# Patient Record
Sex: Female | Born: 1995 | Race: Black or African American | Hispanic: No | Marital: Single | Smoking: Former smoker
Health system: Southern US, Community
[De-identification: ages and names within clinical notes are randomized; demographics above are authoritative.]

## PROBLEM LIST (undated history)

## (undated) DIAGNOSIS — F1911 Other psychoactive substance abuse, in remission: Secondary | ICD-10-CM

## (undated) DIAGNOSIS — D649 Anemia, unspecified: Secondary | ICD-10-CM

## (undated) DIAGNOSIS — Z789 Other specified health status: Secondary | ICD-10-CM

## (undated) DIAGNOSIS — A599 Trichomoniasis, unspecified: Secondary | ICD-10-CM

## (undated) HISTORY — DX: Anemia, unspecified: D64.9

## (undated) HISTORY — DX: Other psychoactive substance abuse, in remission: F19.11

---
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
## 2017-01-01 LAB — GC/CHLAMYDIA PROBE AMP (~~LOC~~) NOT AT ARMC
Chlamydia: NEGATIVE
Neisseria Gonorrhea: NEGATIVE

## 2018-02-27 ENCOUNTER — Telehealth (HOSPITAL_COMMUNITY): Payer: Self-pay | Admitting: *Deleted

## 2018-02-27 ENCOUNTER — Inpatient Hospital Stay (HOSPITAL_COMMUNITY)
Admission: AD | Admit: 2018-02-27 | Discharge: 2018-02-27 | Disposition: A | Discharge status: Home / Self Care | Payer: Medicaid Other | Source: Ambulatory Visit | Attending: Obstetrics and Gynecology | Admitting: Obstetrics and Gynecology

## 2018-02-27 ENCOUNTER — Inpatient Hospital Stay (HOSPITAL_COMMUNITY): Payer: Medicaid Other

## 2018-02-27 DIAGNOSIS — R05 Cough: Secondary | ICD-10-CM

## 2018-02-27 DIAGNOSIS — R52 Pain, unspecified: Secondary | ICD-10-CM | POA: Diagnosis present

## 2018-02-27 DIAGNOSIS — N3000 Acute cystitis without hematuria: Secondary | ICD-10-CM | POA: Diagnosis not present

## 2018-02-27 DIAGNOSIS — J069 Acute upper respiratory infection, unspecified: Secondary | ICD-10-CM | POA: Diagnosis not present

## 2018-02-27 DIAGNOSIS — R509 Fever, unspecified: Secondary | ICD-10-CM

## 2018-02-27 DIAGNOSIS — R059 Cough, unspecified: Secondary | ICD-10-CM

## 2018-02-27 HISTORY — DX: Other specified health status: Z78.9

## 2018-02-27 LAB — URINALYSIS, ROUTINE W REFLEX MICROSCOPIC
Bilirubin Urine: NEGATIVE
Glucose, UA: NEGATIVE mg/dL
Ketones, ur: 5 mg/dL — AB
NITRITE: NEGATIVE
PH: 6 (ref 5.0–8.0)
Protein, ur: NEGATIVE mg/dL
SPECIFIC GRAVITY, URINE: 1.011 (ref 1.005–1.030)
WBC, UA: >50 WBC/hpf — ABNORMAL HIGH (ref 0–5)

## 2018-02-27 LAB — POCT PREGNANCY, URINE: PREG TEST UR: NEGATIVE

## 2018-02-27 LAB — INFLUENZA PANEL BY PCR (TYPE A & B)
INFLAPCR: NEGATIVE
Influenza B By PCR: NEGATIVE

## 2018-02-27 MED ORDER — PROMETHAZINE-DM 6.25-15 MG/5ML PO SYRP: 5.0000 mL | ORAL_SOLUTION | Freq: Four times a day (QID) | ORAL | 0 refills | Status: DC | PRN

## 2018-02-27 MED ORDER — CEPHALEXIN 500 MG PO CAPS: 500.0000 mg | ORAL_CAPSULE | Freq: Two times a day (BID) | ORAL | 0 refills | Status: DC

## 2018-02-27 MED ORDER — ACETAMINOPHEN 500 MG PO TABS
1000.0000 mg | ORAL_TABLET | Freq: Once | ORAL | Status: AC
  Administered 2018-02-27: 1000 mg via ORAL
  Filled 2018-02-27: qty 2

## 2018-02-27 MED ORDER — ONDANSETRON 4 MG PO TBDP
4.0000 mg | ORAL_TABLET | Freq: Once | ORAL | Status: AC
  Administered 2018-02-27: 4 mg via ORAL
  Filled 2018-02-27: qty 1

## 2018-02-27 MED ORDER — CIPROFLOXACIN HCL 500 MG PO TABS: 500.0000 mg | ORAL_TABLET | Freq: Two times a day (BID) | ORAL | 0 refills | Status: DC

## 2018-02-27 NOTE — MAU Note (Signed)
Urine sent to lab 

## 2018-02-27 NOTE — Progress Notes (Signed)
Written and verbal d/c instructions given by Woodroe ChenK Wilson RN. Pt verbalized understanding

## 2018-02-27 NOTE — MAU Provider Note (Addendum)
**Note Amber-Identified via Obfuscation** History     CSN: [id number]  Arrival date and time: 02/27/18 1924   None     Chief Complaint  Patient presents with  . Generalized Body Aches   HPI   Amber Ward is 22 y.o. 651P1001 female presenting for body aches. Has a fever at presentation. Reports her symptoms started with diarrhea and vomiting about 6 days ago. In the last 24 hours she has developed body aches, chills, and a productive cough with mucus. Denies sick contacts. Did not receive flu vaccine this year. Tried taking Tylenol Cold and Flu without relief. She has been unable to tolerate much solid PO intake but has been taking PO liquids normally without vomiting. Patient is also concerned that she may have UTI.   OB History    Gravida  1   Para  1   Term  1   Preterm      AB      Living  1     SAB      TAB      Ectopic      Multiple      Live Births  1           Past Medical History:  Diagnosis Date  . Medical history non-contributory     Past Surgical History:  Procedure Laterality Date  . NO PAST SURGERIES      History reviewed. No pertinent family history.  Social History   Tobacco Use  . Smoking status: Current Some Day Smoker  . Smokeless tobacco: Never Used  Substance Use Topics  . Alcohol use: No  . Drug use: No    Allergies: No Known Allergies  Medications Prior to Admission  Medication Sig Dispense Refill Last Dose  . Phenylephrine-DM-GG-APAP (TYLENOL COLD/FLU SEVERE) 5-10-200-325 MG TABS Take by mouth.   02/26/2018 at Unknown time  . doxycycline (VIBRAMYCIN) 100 MG capsule Take 1 capsule (100 mg total) by mouth 2 (two) times daily. 20 capsule 0   . fluconazole (DIFLUCAN) 150 MG tablet 1 tab po x 1. May repeat in 72 hours if no improvement 2 tablet 0   . metroNIDAZOLE (FLAGYL) 500 MG tablet Take 1 tablet (500 mg total) by mouth 2 (two) times daily. X 7 days 14 tablet 0    Results for orders placed or performed during the hospital encounter of 02/27/18 (from the  past 48 hour(s))  Urinalysis, Routine w reflex microscopic     Status: Abnormal   Collection Time: 02/27/18  7:37 PM  Result Value Ref Range   Color, Urine YELLOW YELLOW   APPearance CLEAR CLEAR   Specific Gravity, Urine 1.011 1.005 - 1.030   pH 6.0 5.0 - 8.0   Glucose, UA NEGATIVE NEGATIVE mg/dL   Hgb urine dipstick SMALL (A) NEGATIVE   Bilirubin Urine NEGATIVE NEGATIVE   Ketones, ur 5 (A) NEGATIVE mg/dL   Protein, ur NEGATIVE NEGATIVE mg/dL   Nitrite NEGATIVE NEGATIVE   Leukocytes, UA MODERATE (A) NEGATIVE   RBC / HPF 0-5 0 - 5 RBC/hpf   WBC, UA >50 (H) 0 - 5 WBC/hpf   Bacteria, UA RARE (A) NONE SEEN   Squamous Epithelial / LPF 0-5 0 - 5   WBC Clumps PRESENT    Mucus PRESENT     Comment: Performed at [facility], [address] [place], [place] [address]  Pregnancy, urine POC     Status: None   Collection Time: 02/27/18  7:40 PM  Result Value Ref Range   Preg  Test, Ur NEGATIVE NEGATIVE    Comment:        THE SENSITIVITY OF THIS METHODOLOGY IS >24 mIU/mL   Influenza panel by PCR (type A & B)     Status: None   Collection Time: 02/27/18  8:17 PM  Result Value Ref Range   Influenza A By PCR NEGATIVE NEGATIVE   Influenza B By PCR NEGATIVE NEGATIVE    Comment: (NOTE) The Xpert Xpress Flu assay is intended as an aid in the diagnosis of  influenza and should not be used as a sole basis for treatment.  This  assay is FDA approved for nasopharyngeal swab specimens only. Nasal  washings and aspirates are unacceptable for Xpert Xpress Flu testing. Performed at [facility], [address] [place], [place] [address]    Review of Systems  Constitutional: Positive for appetite change, chills and fever.  HENT: Negative for congestion, rhinorrhea and sore throat.   Respiratory: Positive for cough. Negative for chest tightness, shortness of breath and wheezing.   Cardiovascular: Negative for chest pain.  Gastrointestinal: Positive for diarrhea, nausea and vomiting.  Negative for abdominal pain.  Genitourinary: Negative for dysuria and urgency.  Musculoskeletal: Positive for myalgias.  Skin: Negative for rash.  Neurological: Positive for headaches. Negative for dizziness and light-headedness.   Physical Exam   Temperature (!) 101.8 F (38.8 C), resp. rate 18, height 5\' 5"  (1.651 m), weight 86.6 kg, last menstrual period 02/09/2018, SpO2 100 %.  Physical Exam  Constitutional: She is oriented to person, place, and time. She appears well-developed and well-nourished.  Appears ill but non-toxic.   HENT:  Head: Normocephalic and atraumatic.  Mouth/Throat: Oropharynx is clear and moist.  MMM.   Eyes: Conjunctivae and EOM are normal.  Neck: Normal range of motion. Neck supple.  Cardiovascular: Normal rate, regular rhythm and normal heart sounds.  No murmur heard. Respiratory: Effort normal and breath sounds normal. No respiratory distress. She has no wheezes. She has no rales.  GI: Soft. She exhibits no distension. There is no abdominal tenderness. There is no rebound and no guarding.  Musculoskeletal: Normal range of motion.     Comments: No CVA tenderness.   Lymphadenopathy:    She has no cervical adenopathy.  Neurological: She is alert and oriented to person, place, and time.  Skin: Skin is warm and dry.  Psychiatric: She has a normal mood and affect. Her behavior is normal.    MAU Course  Procedures  MDM Upreg negative. UA obtained and concerning for potential infectious process with small HgB, moderate leukocytes, >50 WBCs and rare bacteria. Urine culture sent. Influenza PCR obtained. Patient given Tylenol and Zofran. Able to tolerate PO liquids while in MAU. Keflex sent to pharmacy for presumed UTI.   8:54 PM Care turned over to Amber CarbonJennifer Arrin Pintor, NP as lab results still pending.   Assessment and Plan    Amber Ward 02/27/2018, 8:54 PM   Resumed care at 8:54 PM Chest X ray negative.  Influenza negative. Patient with fever X  6 days and generalized body aches; + UA indicating UTI. Will treat as complicated UTI.    A:  1. Acute cystitis without hematuria   2. Cough   3. Fever   4. Upper respiratory infection, viral     P:  Discharge home in stable condition Rx: Cipro, phenergan cough Go to Urgent care if symptoms do not improve Stressed the importance of hydration and rest Cool mist humidifier.  OTC ibuprofen for the pain as  indicated on the bottle  Julea Hutto, Harolyn Rutherford, NP 02/27/2018 10:35 PM

## 2018-02-27 NOTE — MAU Note (Signed)
Body aches for 5 days. Chills at home. Abd pain. Whole body aches. Headaches. Occ vomiting and diarrhea. Some pain in lower chest when I breathe.

## 2018-02-27 NOTE — Discharge Instructions (Signed)
Cough, Adult  A cough helps to clear your throat and lungs. A cough may last only 2-3 weeks (acute), or it may last longer than 8 weeks (chronic). Many different things can cause a cough. A cough may be a sign of an illness or another medical condition. Follow these instructions at home:  Pay attention to any changes in your cough.  Take medicines only as told by your doctor. ? If you were prescribed an antibiotic medicine, take it as told by your doctor. Do not stop taking it even if you start to feel better. ? Talk with your doctor before you try using a cough medicine.  Drink enough fluid to keep your pee (urine) clear or pale yellow.  If the air is dry, use a cold steam vaporizer or humidifier in your home.  Stay away from things that make you cough at work or at home.  If your cough is worse at night, try using extra pillows to raise your head up higher while you sleep.  Do not smoke, and try not to be around smoke. If you need help quitting, ask your doctor.  Do not have caffeine.  Do not drink alcohol.  Rest as needed. Contact a doctor if:  You have new problems (symptoms).  You cough up yellow fluid (pus).  Your cough does not get better after 2-3 weeks, or your cough gets worse.  Medicine does not help your cough and you are not sleeping well.  You have pain that gets worse or pain that is not helped with medicine.  You have a fever.  You are losing weight and you do not know why.  You have night sweats. Get help right away if:  You cough up blood.  You have trouble breathing.  Your heartbeat is very fast. This information is not intended to replace advice given to you by your health care provider. Make sure you discuss any questions you have with your health care provider. Document Released: 11/07/2010 Document Revised: 08/02/2015 Document Reviewed: 05/03/2014 Elsevier Interactive Patient Education  2019 [employer]  [employer] Mist Vaporizer A cool  mist vaporizer is a device that releases a cool mist into the air. If you have a cough or a cold, using a vaporizer may help relieve your symptoms. The mist adds moisture to the air, which may help thin your mucus and make it less sticky. When your mucus is thin and less sticky, it easier for you to breathe and to cough up secretions. Do not use a vaporizer if you are allergic to mold. Follow these instructions at home:  Follow the instructions that come with the vaporizer.  Do not use anything other than distilled water in the vaporizer.  Do not run the vaporizer all of the time. Doing that can cause mold or bacteria to grow in the vaporizer.  Clean the vaporizer after each time that you use it.  Clean and dry the vaporizer well before storing it.  Stop using the vaporizer if your breathing symptoms get worse. This information is not intended to replace advice given to you by your health care provider. Make sure you discuss any questions you have with your health care provider. Document Released: 11/22/2003 Document Revised: 09/14/2015 Document Reviewed: 05/26/2015 Elsevier Interactive Patient Education  [employer]2019 Elsevier Inc. Your urine is concerning for a potential infection. I have sent a 5 day course of Keflex, an antibiotic, to your pharmacy. We have also sent your urine for culture to see if  this is truly a bladder infection.

## 2018-02-27 NOTE — Telephone Encounter (Signed)
Amber Ward called stated that she [facility]. [facility] and wanted to know who was here under her name.Thinks her cousin came in under her name to be seen.  I told her that we did have a person by that name here but that is all I could tell her. Of it was not her . I suggested she cal medical records on Monday to try to get the situation clarified and corrected if needed.

## 2018-03-02 LAB — URINE CULTURE

## 2018-03-12 ENCOUNTER — Other Ambulatory Visit: Payer: Self-pay

## 2018-03-12 ENCOUNTER — Inpatient Hospital Stay (HOSPITAL_COMMUNITY)
Admission: AD | Admit: 2018-03-12 | Discharge: 2018-03-12 | Disposition: A | Discharge status: Home / Self Care | Payer: Medicaid Other | Attending: Obstetrics & Gynecology | Admitting: Obstetrics & Gynecology

## 2018-03-12 ENCOUNTER — Inpatient Hospital Stay (HOSPITAL_COMMUNITY)
Admission: AD | Admit: 2018-03-12 | Discharge: 2018-03-12 | Discharge status: Against Medical Advice | Payer: Medicaid Other | Attending: Obstetrics & Gynecology | Admitting: Obstetrics & Gynecology

## 2018-03-12 DIAGNOSIS — J020 Streptococcal pharyngitis: Secondary | ICD-10-CM | POA: Insufficient documentation

## 2018-03-12 DIAGNOSIS — J029 Acute pharyngitis, unspecified: Secondary | ICD-10-CM

## 2018-03-12 DIAGNOSIS — F17200 Nicotine dependence, unspecified, uncomplicated: Secondary | ICD-10-CM | POA: Insufficient documentation

## 2018-03-12 LAB — URINALYSIS, ROUTINE W REFLEX MICROSCOPIC
Bacteria, UA: NONE SEEN
Bilirubin Urine: NEGATIVE
GLUCOSE, UA: NEGATIVE mg/dL
HGB URINE DIPSTICK: NEGATIVE
KETONES UR: NEGATIVE mg/dL
NITRITE: NEGATIVE
PROTEIN: NEGATIVE mg/dL
Specific Gravity, Urine: 1.016 (ref 1.005–1.030)
pH: 5 (ref 5.0–8.0)

## 2018-03-12 LAB — POCT PREGNANCY, URINE: Preg Test, Ur: NEGATIVE

## 2018-03-12 LAB — GROUP A STREP BY PCR: Group A Strep by PCR: DETECTED — AB

## 2018-03-12 LAB — GC/CHLAMYDIA PROBE AMP (~~LOC~~) NOT AT ARMC
Chlamydia: NEGATIVE
Neisseria Gonorrhea: NEGATIVE

## 2018-03-12 MED ORDER — ACETAMINOPHEN 500 MG PO TABS
1000.0000 mg | ORAL_TABLET | Freq: Once | ORAL | Status: AC
  Administered 2018-03-12: 1000 mg via ORAL
  Filled 2018-03-12: qty 2

## 2018-03-12 MED ORDER — AMOXICILLIN 500 MG PO CAPS
500.0000 mg | ORAL_CAPSULE | Freq: Once | ORAL | Status: AC
  Administered 2018-03-12: 500 mg via ORAL
  Filled 2018-03-12: qty 1

## 2018-03-12 MED ORDER — KETOROLAC TROMETHAMINE 60 MG/2ML IM SOLN
60.0000 mg | Freq: Once | INTRAMUSCULAR | Status: DC
  Filled 2018-03-12: qty 2

## 2018-03-12 MED ORDER — AMOXICILLIN 500 MG PO CAPS: 500.0000 mg | ORAL_CAPSULE | Freq: Two times a day (BID) | ORAL | 0 refills | Status: DC

## 2018-03-12 NOTE — Discharge Instructions (Signed)
Pharyngitis  Pharyngitis is a sore throat (pharynx). This is when there is redness, pain, and swelling in your throat. Most of the time, this condition gets better on its own. In some cases, you may need medicine. Follow these instructions at home:  Take over-the-counter and prescription medicines only as told by your doctor. ? If you were prescribed an antibiotic medicine, take it as told by your doctor. Do not stop taking the antibiotic even if you start to feel better. ? Do not give children aspirin. Aspirin has been linked to Reye syndrome.  Drink enough water and fluids to keep your pee (urine) clear or pale yellow.  Get a lot of rest.  Rinse your mouth (gargle) with a salt-water mixture 3-4 times a day or as needed. To make a salt-water mixture, completely dissolve -1 tsp of salt in 1 cup of warm water.  If your doctor approves, you may use throat lozenges or sprays to soothe your throat. Contact a doctor if:  You have large, tender lumps in your neck.  You have a rash.  You cough up green, yellow-brown, or bloody spit. Get help right away if:  You have a stiff neck.  You drool or cannot swallow liquids.  You cannot drink or take medicines without throwing up.  You have very bad pain that does not go away with medicine.  You have problems breathing, and it is not from a stuffy nose.  You have new pain and swelling in your knees, ankles, wrists, or elbows. Summary  Pharyngitis is a sore throat (pharynx). This is when there is redness, pain, and swelling in your throat.  If you were prescribed an antibiotic medicine, take it as told by your doctor. Do not stop taking the antibiotic even if you start to feel better.  Most of the time, pharyngitis gets better on its own. Sometimes, you may need medicine. This information is not intended to replace advice given to you by your health care provider. Make sure you discuss any questions you have with your health care  provider. Document Released: 08/13/2007 Document Revised: 04/01/2016 Document Reviewed: 04/01/2016 Elsevier Interactive Patient Education  2019 Elsevier Inc.  

## 2018-03-12 NOTE — MAU Note (Signed)
Pt states she started having a cough 3 days ago. She states yesterday she started having a sore throat.

## 2018-03-12 NOTE — MAU Note (Signed)
PT HAS RETURNED - SAYING SHE WANTS TREATMENT

## 2018-03-12 NOTE — MAU Provider Note (Signed)
  History     CSN: [id number]  Arrival date and time: 03/12/18 0226   First Provider Initiated Contact with Patient 03/12/18 0302      Chief Complaint  Patient presents with  . Sore Throat   Amber Ward presents for a 3-day history of sore throat She states that it has been getting worse since it started Reports coughing up green sputum that feels like it's coming from her throat Also, has had a cough and nasal congestion No SOB, but reports difficulty breathing due to nasal congestion Denies fevers, body aches or feeling sick like the flu Reports difficulty sleeping, eating or doing anything else due to pain Concerned that throat infection may also be sexually transmitted    Past Medical History:  Diagnosis Date  . Medical history non-contributory     Past Surgical History:  Procedure Laterality Date  . NO PAST SURGERIES      History reviewed. No pertinent family history.  Social History   Tobacco Use  . Smoking status: Current Every Day Smoker  . Smokeless tobacco: Never Used  Substance Use Topics  . Alcohol use: No  . Drug use: Yes    Types: Marijuana    Allergies: No Known Allergies  Medications Prior to Admission  Medication Sig Dispense Refill Last Dose  . ciprofloxacin (CIPRO) 500 MG tablet Take 1 tablet (500 mg total) by mouth every 12 (twelve) hours. 14 tablet 0   . Phenylephrine-DM-GG-APAP (TYLENOL COLD/FLU SEVERE) 5-10-200-325 MG TABS Take by mouth.   02/26/2018 at Unknown time  . promethazine-dextromethorphan (PROMETHAZINE-DM) 6.25-15 MG/5ML syrup Take 5 mLs by mouth 4 (four) times daily as needed for cough. 118 mL 0     Review of Systems  All other systems reviewed and are negative.  Physical Exam   Blood pressure 126/75, pulse 91, temperature 98.3 F (36.8 C), resp. rate 20, weight 87.5 kg, SpO2 100 %.  Physical Exam  Constitutional: She is oriented to person, place, and time. She appears well-developed and well-nourished. She appears  distressed.  Tearful and upset  HENT:  Head: Normocephalic and atraumatic.  Mouth/Throat: Oropharyngeal exudate (R>L edema, erythema and exudate) present.  Eyes: Pupils are equal, round, and reactive to light. Right eye exhibits no discharge. Left eye exhibits no discharge. No scleral icterus.  Dark circles under eyes  Cardiovascular: Normal rate, regular rhythm and normal heart sounds. Exam reveals no gallop and no friction rub.  No murmur heard. Respiratory: Effort normal and breath sounds normal. No respiratory distress. She has no wheezes. She has no rales.  Neurological: She is alert and oriented to person, place, and time. No cranial nerve deficit.  Skin: She is not diaphoretic.  Psychiatric:  Combative with staff    MAU Course  Procedures  MDM Collected GC/chlyamydia and rapid strep swabs Offered treatment with PO tylenol and IM toradol, which pt declines and leaves after threatening nursing staff  Assessment and Plan  Sore throat - pt left without being treated - results pending; will call with results that require treatment - unable to discuss return precautions   Gwenevere Abbot 03/12/2018, 3:58 AM

## 2018-03-12 NOTE — MAU Note (Signed)
Assumed care of this patient about 0330. I went in to speak w/patient about the plan of care. I sat down and introduced myself and told her I had a few questions. I explained that the strep culture might take up to 4 hours to come back and asked if she wanted to wait for that.  I  started to tell her about the plan for her pain meds, but she started shouted that she didn't want to leave unless "her pain was gone." I told her we were going to give her pain medicine but that I just wanted to let the doctor know if she wanted to stay to see results. She kept yelling about her pain and said "where's Lowella Bandy?" Lowella Bandy had been her nurse and went in and I left. Nikki explained the plan again and said the patient understood.    RN went in to give patient a shot of Toradol for pain and some Tylenol. She took the Tylenol. However,  she was upset about the Toradol and stated that  that she is "scared of that" and wanted to see where I was going to inject it. I showed her the spot on her backside and she refused this area as well as her thigh. She only wanted it in her arm which was contraindicated d/t the amount of fluid.   Patient was agitated and raising her voice about the shot and just wanting her pill  and "wanting to be cured." I explained  again that we could give her pain meds and send her home w/an antibiotic and then follow up when her Strep results were back.  She continued to insist loudly that she "just wanted something for pain; I'm not leaving until I get rid of this pain." I explained again about the plan and asked if she wanted the shot. She agreed to a spot on her hip but as I was giving it she shouted that "she needed to see it" and insisted that I move my hand.  I was trying to reposition my hand when she lept up and shouted that she was leaving and started yelling and saying "forget this." When she got up there was a Tylenol on the bed. I asked the patient if she would please take the pill and she  yelled at me to just leave it on the bed. I said she needed to either take the pill in front of me or I would discard it. She yelled at me and began swearing. I asked her to either please calm down or I would call security. Security was already in the nurses station so she went in and spoke to patient and escorted her out to the lobby. The patient told security she was "going to punch" me.    I relayed all of this to the MAU provider.

## 2019-04-26 ENCOUNTER — Other Ambulatory Visit: Payer: Self-pay

## 2019-04-26 ENCOUNTER — Inpatient Hospital Stay (HOSPITAL_COMMUNITY)
Admission: AD | Admit: 2019-04-26 | Discharge: 2019-04-27 | Disposition: A | Discharge status: Home / Self Care | Payer: Medicaid Other | Attending: Family Medicine | Admitting: Family Medicine

## 2019-04-26 ENCOUNTER — Encounter (HOSPITAL_COMMUNITY): Payer: Self-pay | Admitting: Family Medicine

## 2019-04-26 DIAGNOSIS — R112 Nausea with vomiting, unspecified: Secondary | ICD-10-CM

## 2019-04-26 DIAGNOSIS — Z79899 Other long term (current) drug therapy: Secondary | ICD-10-CM | POA: Insufficient documentation

## 2019-04-26 DIAGNOSIS — O34219 Maternal care for unspecified type scar from previous cesarean delivery: Secondary | ICD-10-CM | POA: Insufficient documentation

## 2019-04-26 DIAGNOSIS — R197 Diarrhea, unspecified: Secondary | ICD-10-CM | POA: Insufficient documentation

## 2019-04-26 DIAGNOSIS — Z87891 Personal history of nicotine dependence: Secondary | ICD-10-CM | POA: Diagnosis not present

## 2019-04-26 DIAGNOSIS — R0981 Nasal congestion: Secondary | ICD-10-CM | POA: Diagnosis not present

## 2019-04-26 DIAGNOSIS — Z3A30 30 weeks gestation of pregnancy: Secondary | ICD-10-CM | POA: Insufficient documentation

## 2019-04-26 DIAGNOSIS — R195 Other fecal abnormalities: Secondary | ICD-10-CM

## 2019-04-26 DIAGNOSIS — O212 Late vomiting of pregnancy: Secondary | ICD-10-CM | POA: Diagnosis present

## 2019-04-26 DIAGNOSIS — O99613 Diseases of the digestive system complicating pregnancy, third trimester: Secondary | ICD-10-CM | POA: Insufficient documentation

## 2019-04-26 DIAGNOSIS — R519 Headache, unspecified: Secondary | ICD-10-CM | POA: Diagnosis not present

## 2019-04-26 DIAGNOSIS — O99891 Other specified diseases and conditions complicating pregnancy: Secondary | ICD-10-CM | POA: Insufficient documentation

## 2019-04-26 DIAGNOSIS — O219 Vomiting of pregnancy, unspecified: Secondary | ICD-10-CM | POA: Diagnosis not present

## 2019-04-26 LAB — URINALYSIS, ROUTINE W REFLEX MICROSCOPIC
Bilirubin Urine: NEGATIVE
Glucose, UA: NEGATIVE mg/dL
Ketones, ur: 20 mg/dL — AB
Nitrite: NEGATIVE
Protein, ur: 30 mg/dL — AB
Specific Gravity, Urine: 1.01 (ref 1.005–1.030)
WBC, UA: >50 WBC/hpf — ABNORMAL HIGH (ref 0–5)
pH: 6 (ref 5.0–8.0)

## 2019-04-26 MED ORDER — SODIUM CHLORIDE 0.9 % IV SOLN: Freq: Once | INTRAVENOUS | Status: AC

## 2019-04-26 MED ORDER — PROMETHAZINE HCL 25 MG/ML IJ SOLN
12.5000 mg | Freq: Once | INTRAMUSCULAR | Status: AC
  Administered 2019-04-27: 12.5 mg via INTRAVENOUS
  Filled 2019-04-26: qty 1

## 2019-04-26 MED ORDER — SODIUM CHLORIDE 0.9 % IV SOLN: INTRAVENOUS | Status: DC

## 2019-04-26 NOTE — MAU Note (Signed)
PT SAYS SHE HAS BEEN TO SEE DR DORN 3-4 TIMES . SHE WANTS  TO SWITCH  TO  ANOTHER  DR IN [place].   SAYS  HAS H/A - STARTED YESTERDAY , PLUS  VOMITING, DIARRHEA- LOOSE STOOL.  , NO FEVER.Marland Kitchen  PT IS A SITTER- PT OK.
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
## 2019-04-27 DIAGNOSIS — O219 Vomiting of pregnancy, unspecified: Secondary | ICD-10-CM

## 2019-04-27 DIAGNOSIS — Z3A30 30 weeks gestation of pregnancy: Secondary | ICD-10-CM

## 2019-04-27 LAB — CBC WITH DIFFERENTIAL/PLATELET
Abs Immature Granulocytes: 0.05 10*3/uL (ref 0.00–0.07)
Basophils Absolute: 0 10*3/uL (ref 0.0–0.1)
Basophils Relative: 0 %
Eosinophils Absolute: 0 10*3/uL (ref 0.0–0.5)
Eosinophils Relative: 0 %
HCT: 28.8 % — ABNORMAL LOW (ref 36.0–46.0)
Hemoglobin: 8.9 g/dL — ABNORMAL LOW (ref 12.0–15.0)
Immature Granulocytes: 0 %
Lymphocytes Relative: 14 %
Lymphs Abs: 1.7 10*3/uL (ref 0.7–4.0)
MCH: 21.9 pg — ABNORMAL LOW (ref 26.0–34.0)
MCHC: 30.9 g/dL (ref 30.0–36.0)
MCV: 70.8 fL — ABNORMAL LOW (ref 80.0–100.0)
Monocytes Absolute: 1.2 10*3/uL — ABNORMAL HIGH (ref 0.1–1.0)
Monocytes Relative: 10 %
Neutro Abs: 9 10*3/uL — ABNORMAL HIGH (ref 1.7–7.7)
Neutrophils Relative %: 76 %
Platelets: 365 10*3/uL (ref 150–400)
RBC: 4.07 MIL/uL (ref 3.87–5.11)
RDW: 15.9 % — ABNORMAL HIGH (ref 11.5–15.5)
WBC: 12 10*3/uL — ABNORMAL HIGH (ref 4.0–10.5)
nRBC: 0 % (ref 0.0–0.2)

## 2019-04-27 LAB — BASIC METABOLIC PANEL
Anion gap: 14 (ref 5–15)
BUN: <5 mg/dL — ABNORMAL LOW (ref 6–20)
CO2: 19 mmol/L — ABNORMAL LOW (ref 22–32)
Calcium: 8.5 mg/dL — ABNORMAL LOW (ref 8.9–10.3)
Chloride: 100 mmol/L (ref 98–111)
Creatinine, Ser: 0.6 mg/dL (ref 0.44–1.00)
GFR calc Af Amer: >60 mL/min (ref >60)
GFR calc non Af Amer: >60 mL/min (ref >60)
Glucose, Bld: 87 mg/dL (ref 70–99)
Potassium: 3.4 mmol/L — ABNORMAL LOW (ref 3.5–5.1)
Sodium: 133 mmol/L — ABNORMAL LOW (ref 135–145)

## 2019-04-27 MED ORDER — PROMETHAZINE HCL 25 MG PO TABS: 25.0000 mg | ORAL_TABLET | Freq: Four times a day (QID) | ORAL | 2 refills | Status: AC | PRN

## 2019-04-27 NOTE — Discharge Instructions (Signed)
Nausea and Vomiting, Adult Nausea is the feeling that you have an upset stomach or that you are about to vomit. Vomiting is when stomach contents are thrown up and out of the mouth as a result of nausea. Vomiting can make you feel weak and cause you to become dehydrated. Dehydration can make you feel tired and thirsty, cause you to have a dry mouth, and decrease how often you urinate. Older adults and people with other diseases or a weak disease-fighting system (immune system) are at higher risk for dehydration. It is important to treat your nausea and vomiting as told by your health care provider. Follow these instructions at home: Watch your symptoms for any changes. Tell your health care provider about them. Follow these instructions to care for yourself at home. Eating and drinking      Take an oral rehydration solution (ORS). This is a drink that is sold at pharmacies and retail stores.  Drink clear fluids slowly and in small amounts as you are able. Clear fluids include water, ice chips, low-calorie sports drinks, and fruit juice that has water added (diluted fruit juice).  Eat bland, easy-to-digest foods in small amounts as you are able. These foods include bananas, applesauce, rice, lean meats, toast, and crackers.  Avoid fluids that contain a lot of sugar or caffeine, such as energy drinks, sports drinks, and soda.  Avoid alcohol.  Avoid spicy or fatty foods. General instructions  Take over-the-counter and prescription medicines only as told by your health care provider.  Drink enough fluid to keep your urine pale yellow.  Wash your hands often using soap and water. If soap and water are not available, use hand sanitizer.  Make sure that all people in your household wash their hands well and often.  Rest at home while you recover.  Watch your condition for any changes.  Breathe slowly and deeply when you feel nauseated.  Keep all follow-up visits as told by your health  care provider. This is important. Contact a health care provider if:  Your symptoms get worse.  You have new symptoms.  You have a fever.  You cannot drink fluids without vomiting.  Your nausea does not go away after 2 days.  You feel light-headed or dizzy.  You have a headache.  You have muscle cramps.  You have a rash.  You have pain while urinating. Get help right away if:  You have pain in your chest, neck, arm, or jaw.  You feel extremely weak or you faint.  You have persistent vomiting.  You have vomit that is bright red or looks like black coffee grounds.  You have bloody or black stools or stools that look like tar.  You have a severe headache, a stiff neck, or both.  You have severe pain, cramping, or bloating in your abdomen.  You have difficulty breathing, or you are breathing very quickly.  Your heart is beating very quickly.  Your skin feels cold and clammy.  You feel confused.  You have signs of dehydration, such as: ? Dark urine, very little urine, or no urine. ? Cracked lips. ? Dry mouth. ? Sunken eyes. ? Sleepiness. ? Weakness. These symptoms may represent a serious problem that is an emergency. Do not wait to see if the symptoms will go away. Get medical help right away. Call your local emergency services (911 in the [place] Do not drive yourself to the hospital. Summary  Nausea is the feeling that you have an upset stomach   or that you are about to vomit. As nausea gets worse, it can lead to vomiting. Vomiting can make you feel weak and cause you to become dehydrated.  Follow instructions from your health care provider about eating and drinking to prevent dehydration.  Take over-the-counter and prescription medicines only as told by your health care provider.  Contact your health care provider if your symptoms get worse, or you have new symptoms.  Keep all follow-up visits as told by your health care provider. This is important. This  information is not intended to replace advice given to you by your health care provider. Make sure you discuss any questions you have with your health care provider. Document Revised: 06/18/2018 Document Reviewed: 08/04/2017 Elsevier Patient Education  2020 [place]. COVID-19: How to Protect Yourself and Others Know how it spreads  There is currently no vaccine to prevent coronavirus disease 2019 (COVID-19).  The best way to prevent illness is to avoid being exposed to this virus.  The virus is thought to spread mainly from person-to-person. ? Between people who are in close contact with one another (within about 6 feet). ? Through respiratory droplets produced when an infected person coughs, sneezes or talks. ? These droplets can land in the mouths or noses of people who are nearby or possibly be inhaled into the lungs. ? COVID-19 may be spread by people who are not showing symptoms. Everyone should Clean your hands often  Wash your hands often with soap and water for at least 20 seconds especially after you have been in a public place, or after blowing your nose, coughing, or sneezing.  If soap and water are not readily available, use a hand sanitizer that contains at least 60% alcohol. Cover all surfaces of your hands and rub them together until they feel dry.  Avoid touching your eyes, nose, and mouth with unwashed hands. Avoid close contact  Limit contact with others as much as possible.  Avoid close contact with people who are sick.  Put distance between yourself and other people. ? Remember that some people without symptoms may be able to spread virus. ? This is especially important for people who are at higher risk of getting very [employer]cy Cover your mouth and nose with a mask when around others  You could spread COVID-19 to others even if you do not feel sick.  Everyone should wear a mask  in public settings and when around people not living in their household, especially when social distancing is difficult to maintain. ? Masks should not be placed on young children under age 64, anyone who has trouble breathing, or is unconscious, incapacitated or otherwise unable to remove the mask without assistance.  The mask is meant to protect other people in case you are infected.  Do NOT use a facemask meant for a Dietitian.  Continue to keep about 6 feet between yourself and others. The mask is not a substitute for social distancing. Cover coughs and sneezes  Always cover your mouth and nose with a tissue when you cough or sneeze or use the inside of your elbow.  Throw used tissues in the trash.  Immediately wash your hands with soap and water for at least 20 seconds. If soap and water are not readily available, clean your hands with a hand sanitizer that contains at least 60% alcohol. Clean and disinfect  Clean AND disinfect frequently touched surfaces daily. This includes tables, doorknobs, light switches, countertops, handles, desks, phones, keyboards, toilets, faucets, and  sinks. ktimeonline.com  If surfaces are dirty, clean them: Use detergent or soap and water prior to disinfection.  Then, use a household disinfectant. You can see a list of EPA-registered household disinfectants here. [employer] 11/10/2018 This information is not intended to replace advice given to you by your health care provider. Make sure you discuss any questions you have with your health care provider. Document Revised: 11/18/2018 Document Reviewed: 09/16/2018 Elsevier Patient Education  2020 [employer].

## 2019-09-27 ENCOUNTER — Emergency Department (HOSPITAL_COMMUNITY)
Admission: EM | Admit: 2019-09-27 | Discharge: 2019-09-27 | Discharge status: Against Medical Advice | Payer: Medicaid Other | Attending: Emergency Medicine | Admitting: Emergency Medicine

## 2019-09-27 ENCOUNTER — Encounter (HOSPITAL_COMMUNITY): Payer: Self-pay | Admitting: Emergency Medicine

## 2019-09-27 DIAGNOSIS — W3400XA Accidental discharge from unspecified firearms or gun, initial encounter: Secondary | ICD-10-CM | POA: Insufficient documentation

## 2019-09-27 DIAGNOSIS — Y939 Activity, unspecified: Secondary | ICD-10-CM | POA: Insufficient documentation

## 2019-09-27 DIAGNOSIS — Y990 Civilian activity done for income or pay: Secondary | ICD-10-CM | POA: Diagnosis not present

## 2019-09-27 DIAGNOSIS — S4991XA Unspecified injury of right shoulder and upper arm, initial encounter: Secondary | ICD-10-CM | POA: Diagnosis present

## 2019-09-27 DIAGNOSIS — S41101A Unspecified open wound of right upper arm, initial encounter: Secondary | ICD-10-CM | POA: Diagnosis not present

## 2019-09-27 DIAGNOSIS — Y929 Unspecified place or not applicable: Secondary | ICD-10-CM | POA: Diagnosis not present

## 2019-09-27 LAB — ETHANOL: Alcohol, Ethyl (B): <10 mg/dL (ref <10)

## 2019-09-27 LAB — COMPREHENSIVE METABOLIC PANEL
ALT: 18 U/L (ref 0–44)
AST: 19 U/L (ref 15–41)
Albumin: 3.7 g/dL (ref 3.5–5.0)
Alkaline Phosphatase: 67 U/L (ref 38–126)
Anion gap: 13 (ref 5–15)
BUN: 9 mg/dL (ref 6–20)
CO2: 19 mmol/L — ABNORMAL LOW (ref 22–32)
Calcium: 8.7 mg/dL — ABNORMAL LOW (ref 8.9–10.3)
Chloride: 107 mmol/L (ref 98–111)
Creatinine, Ser: 0.9 mg/dL (ref 0.44–1.00)
GFR calc Af Amer: >60 mL/min (ref >60)
GFR calc non Af Amer: >60 mL/min (ref >60)
Glucose, Bld: 91 mg/dL (ref 70–99)
Potassium: 4 mmol/L (ref 3.5–5.1)
Sodium: 139 mmol/L (ref 135–145)
Total Bilirubin: 0.6 mg/dL (ref 0.3–1.2)
Total Protein: 7.1 g/dL (ref 6.5–8.1)

## 2019-09-27 LAB — CBC
HCT: 35.8 % — ABNORMAL LOW (ref 36.0–46.0)
Hemoglobin: 10.4 g/dL — ABNORMAL LOW (ref 12.0–15.0)
MCH: 19.8 pg — ABNORMAL LOW (ref 26.0–34.0)
MCHC: 29.1 g/dL — ABNORMAL LOW (ref 30.0–36.0)
MCV: 68.3 fL — ABNORMAL LOW (ref 80.0–100.0)
RBC: 5.24 MIL/uL — ABNORMAL HIGH (ref 3.87–5.11)
RDW: 18.3 % — ABNORMAL HIGH (ref 11.5–15.5)
WBC: 6.5 10*3/uL (ref 4.0–10.5)
nRBC: 0 % (ref 0.0–0.2)

## 2019-09-27 LAB — I-STAT CHEM 8, ED
BUN: 9 mg/dL (ref 6–20)
Calcium, Ion: 1.16 mmol/L (ref 1.15–1.40)
Chloride: 106 mmol/L (ref 98–111)
Creatinine, Ser: 0.9 mg/dL (ref 0.44–1.00)
Glucose, Bld: 92 mg/dL (ref 70–99)
HCT: 35 % — ABNORMAL LOW (ref 36.0–46.0)
Hemoglobin: 11.9 g/dL — ABNORMAL LOW (ref 12.0–15.0)
Potassium: 3.9 mmol/L (ref 3.5–5.1)
Sodium: 141 mmol/L (ref 135–145)
TCO2: 24 mmol/L (ref 22–32)

## 2019-09-27 LAB — I-STAT BETA HCG BLOOD, ED (MC, WL, AP ONLY): I-stat hCG, quantitative: <5 m[IU]/mL (ref <5)

## 2019-09-27 LAB — SAMPLE TO BLOOD BANK

## 2019-09-27 LAB — LACTIC ACID, PLASMA: Lactic Acid, Venous: 1.4 mmol/L (ref 0.5–1.9)

## 2019-09-27 LAB — PROTIME-INR
INR: 1.1 (ref 0.8–1.2)
Prothrombin Time: 13.4 seconds (ref 11.4–15.2)

## 2019-09-27 MED ORDER — HALOPERIDOL LACTATE 5 MG/ML IJ SOLN
5.0000 mg | Freq: Once | INTRAMUSCULAR | Status: DC
  Filled 2019-09-27: qty 1
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
## 2019-09-27 NOTE — ED Notes (Addendum)
Tdap refused
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
## 2020-11-14 ENCOUNTER — Emergency Department (HOSPITAL_BASED_OUTPATIENT_CLINIC_OR_DEPARTMENT_OTHER)
Admission: EM | Admit: 2020-11-14 | Discharge: 2020-11-14 | Disposition: A | Discharge status: Home / Self Care | Payer: Medicaid Other | Attending: Emergency Medicine | Admitting: Emergency Medicine

## 2020-11-14 ENCOUNTER — Other Ambulatory Visit: Payer: Self-pay

## 2020-11-14 ENCOUNTER — Encounter (HOSPITAL_BASED_OUTPATIENT_CLINIC_OR_DEPARTMENT_OTHER): Payer: Self-pay

## 2020-11-14 DIAGNOSIS — F17210 Nicotine dependence, cigarettes, uncomplicated: Secondary | ICD-10-CM | POA: Diagnosis not present

## 2020-11-14 DIAGNOSIS — R1030 Lower abdominal pain, unspecified: Secondary | ICD-10-CM

## 2020-11-14 DIAGNOSIS — A599 Trichomoniasis, unspecified: Secondary | ICD-10-CM | POA: Insufficient documentation

## 2020-11-14 DIAGNOSIS — B9689 Other specified bacterial agents as the cause of diseases classified elsewhere: Secondary | ICD-10-CM | POA: Insufficient documentation

## 2020-11-14 LAB — CBC WITH DIFFERENTIAL/PLATELET
Abs Immature Granulocytes: 0.01 10*3/uL (ref 0.00–0.07)
Basophils Absolute: 0.1 10*3/uL (ref 0.0–0.1)
Basophils Relative: 1 %
Eosinophils Absolute: 0.1 10*3/uL (ref 0.0–0.5)
Eosinophils Relative: 2 %
HCT: 33.2 % — ABNORMAL LOW (ref 36.0–46.0)
Hemoglobin: 10.1 g/dL — ABNORMAL LOW (ref 12.0–15.0)
Immature Granulocytes: 0 %
Lymphocytes Relative: 41 %
Lymphs Abs: 2.6 10*3/uL (ref 0.7–4.0)
MCH: 20.6 pg — ABNORMAL LOW (ref 26.0–34.0)
MCHC: 30.4 g/dL (ref 30.0–36.0)
MCV: 67.6 fL — ABNORMAL LOW (ref 80.0–100.0)
Monocytes Absolute: 0.5 10*3/uL (ref 0.1–1.0)
Monocytes Relative: 7 %
Neutro Abs: 3.2 10*3/uL (ref 1.7–7.7)
Neutrophils Relative %: 49 %
Platelets: 517 10*3/uL — ABNORMAL HIGH (ref 150–400)
RBC: 4.91 MIL/uL (ref 3.87–5.11)
RDW: 17.2 % — ABNORMAL HIGH (ref 11.5–15.5)
WBC: 6.5 10*3/uL (ref 4.0–10.5)
nRBC: 0 % (ref 0.0–0.2)

## 2020-11-14 LAB — COMPREHENSIVE METABOLIC PANEL
ALT: 11 U/L (ref 0–44)
AST: 15 U/L (ref 15–41)
Albumin: 4.4 g/dL (ref 3.5–5.0)
Alkaline Phosphatase: 77 U/L (ref 38–126)
Anion gap: 7 (ref 5–15)
BUN: 11 mg/dL (ref 6–20)
CO2: 25 mmol/L (ref 22–32)
Calcium: 8.9 mg/dL (ref 8.9–10.3)
Chloride: 104 mmol/L (ref 98–111)
Creatinine, Ser: 0.82 mg/dL (ref 0.44–1.00)
GFR, Estimated: >60 mL/min (ref >60)
Glucose, Bld: 94 mg/dL (ref 70–99)
Potassium: 3.4 mmol/L — ABNORMAL LOW (ref 3.5–5.1)
Sodium: 136 mmol/L (ref 135–145)
Total Bilirubin: 0.2 mg/dL — ABNORMAL LOW (ref 0.3–1.2)
Total Protein: 8.4 g/dL — ABNORMAL HIGH (ref 6.5–8.1)

## 2020-11-14 LAB — URINALYSIS, MICROSCOPIC (REFLEX): WBC, UA: NONE SEEN WBC/hpf (ref 0–5)

## 2020-11-14 LAB — URINALYSIS, ROUTINE W REFLEX MICROSCOPIC
Bilirubin Urine: NEGATIVE
Glucose, UA: NEGATIVE mg/dL
Ketones, ur: NEGATIVE mg/dL
Leukocytes,Ua: NEGATIVE
Nitrite: NEGATIVE
Protein, ur: NEGATIVE mg/dL
Specific Gravity, Urine: 1.03 (ref 1.005–1.030)
pH: 6 (ref 5.0–8.0)

## 2020-11-14 LAB — PREGNANCY, URINE: Preg Test, Ur: NEGATIVE

## 2020-11-14 LAB — WET PREP, GENITAL
Sperm: NONE SEEN
Yeast Wet Prep HPF POC: NONE SEEN

## 2020-11-14 LAB — LIPASE, BLOOD: Lipase: 29 U/L (ref 11–51)

## 2020-11-14 MED ORDER — METRONIDAZOLE 500 MG PO TABS
2000.0000 mg | ORAL_TABLET | Freq: Once | ORAL | Status: AC
  Administered 2020-11-14: 2000 mg via ORAL
  Filled 2020-11-14: qty 4

## 2020-11-14 NOTE — ED Provider Notes (Signed)
Emergency Department Provider Note   I have reviewed the triage vital signs and the nursing notes.   HISTORY  Chief Complaint Abdominal Pain   HPI Amber Ward is a 25 y.o. female presents to the emergency department for evaluation of lower abdominal pain over the past 2 weeks.  She describes intermittent pain in the right abdomen worse at night.  She not having fevers or chills.  No radiation of pain.  She states pain is severe at times but at other times not bothering her.  She not having vomiting or diarrhea.  She has had some irregular menstrual bleeding.  No lightheadedness or passing out.  No upper respiratory infection symptoms.  No dysuria, hesitancy, urgency.  No known history of fibroids.   Past Medical History:  Diagnosis Date   Trichomoniasis 2016    There are no problems to display for this patient.   Past Surgical History:  Procedure Laterality Date   CESAREAN SECTION      Allergies Patient has no known allergies.  No family history on file.  Social History Social History   Tobacco Use   Smoking status: Every Day    Types: Cigarettes   Smokeless tobacco: Never  Vaping Use   Vaping Use: Never used  Substance Use Topics   Alcohol use: Not Currently   Drug use: Not Currently    Review of Systems  Constitutional: No fever/chills Eyes: No visual changes. ENT: No sore throat. Cardiovascular: Denies chest pain. Respiratory: Denies shortness of breath. Gastrointestinal: Positive lower abdominal pain.  No nausea, no vomiting.  No diarrhea.  No constipation. Genitourinary: Negative for dysuria. Musculoskeletal: Negative for back pain. Skin: Negative for rash. Neurological: Negative for headaches, focal weakness or numbness.  10-point ROS otherwise negative.  ____________________________________________   PHYSICAL EXAM:  VITAL SIGNS: ED Triage Vitals  Enc Vitals Group     BP 11/14/20 1541 134/79     Pulse Rate 11/14/20 1541 86      Resp 11/14/20 1541 18     Temp 11/14/20 1541 98.9 F (37.2 C)     Temp Source 11/14/20 1541 Oral     SpO2 11/14/20 1541 100 %     Weight 11/14/20 1541 204 lb (92.5 kg)     Height 11/14/20 1541 5\' 5"  (1.651 m)   Constitutional: Alert and oriented. Well appearing and in no acute distress. Standing and walking without difficulty.  Eyes: Conjunctivae are normal.  Head: Atraumatic. Nose: No congestion/rhinnorhea. Mouth/Throat: Mucous membranes are moist.  Oropharynx non-erythematous. Neck: No stridor.  Cardiovascular: Normal rate, regular rhythm. Good peripheral circulation. Grossly normal heart sounds.   Respiratory: Normal respiratory effort.  No retractions. Lungs CTAB. Gastrointestinal: Soft with mild lower abdominal tenderness. No rebound or guarding. No distention.  Musculoskeletal: No lower extremity tenderness nor edema. No gross deformities of extremities. Neurologic:  Normal speech and language. No gross focal neurologic deficits are appreciated.  Skin:  Skin is warm, dry and intact. No rash noted.  ____________________________________________   LABS (all labs ordered are listed, but only abnormal results are displayed)  Labs Reviewed  WET PREP, GENITAL - Abnormal; Notable for the following components:      Result Value   Trich, Wet Prep PRESENT (*)    Clue Cells Wet Prep HPF POC PRESENT (*)    WBC, Wet Prep HPF POC FEW (*)    All other components within normal limits  CBC WITH DIFFERENTIAL/PLATELET - Abnormal; Notable for the following components:   Hemoglobin 10.1 (*)  HCT 33.2 (*)    MCV 67.6 (*)    MCH 20.6 (*)    RDW 17.2 (*)    Platelets 517 (*)    All other components within normal limits  COMPREHENSIVE METABOLIC PANEL - Abnormal; Notable for the following components:   Potassium 3.4 (*)    Total Protein 8.4 (*)    Total Bilirubin 0.2 (*)    All other components within normal limits  URINALYSIS, ROUTINE W REFLEX MICROSCOPIC - Abnormal; Notable for the  following components:   Hgb urine dipstick SMALL (*)    All other components within normal limits  URINALYSIS, MICROSCOPIC (REFLEX) - Abnormal; Notable for the following components:   Bacteria, UA RARE (*)    All other components within normal limits  GC/CHLAMYDIA PROBE AMP ([place]) NOT AT [facility] - Abnormal; Notable for the following components:   Chlamydia Positive (*)    Neisseria Gonorrhea Positive (*)    All other components within normal limits  LIPASE, BLOOD  PREGNANCY, URINE   ____________________________________________  RADIOLOGY  None   ____________________________________________   PROCEDURES  Procedure(s) performed:   Procedures  None  ____________________________________________   INITIAL IMPRESSION / ASSESSMENT AND PLAN / ED COURSE  Pertinent labs & imaging results that were available during my care of the patient were reviewed by me and considered in my medical decision making (see chart for details).   Patient presents to the emergency department for evaluation of lower abdominal pain over the past 2 weeks.  Some irregular menstrual bleeding but no fever or UTI symptoms.  Differential diagnosis includes but is not exclusive to ectopic pregnancy, ovarian cyst, ovarian torsion, acute appendicitis, urinary tract infection, endometriosis, bowel obstruction, hernia, colitis, renal colic, gastroenteritis, volvulus etc.  Plan for screening labs and reassess. Overall, abdominal exam is reassuring and vitals are WNL.   Lab work reviewed.  No leukocytosis.  Repeat abdominal exam is unremarkable.  Patient is very well-appearing.  We discussed performing a pelvic exam.  Patient states that she does not want a full exam but is happy to self swab which was ordered. No indication for emergent imaging at this time.  ____________________________________________  FINAL CLINICAL IMPRESSION(S) / ED DIAGNOSES  Final diagnoses:  Lower abdominal pain  Trichimoniasis      MEDICATIONS GIVEN DURING THIS VISIT:  Medications  metroNIDAZOLE (FLAGYL) tablet 2,000 mg (2,000 mg Oral Given 11/14/20 1846)      Note:  This document was prepared using Dragon voice recognition software and may include unintentional dictation errors.  Alona Bene, MD, [facility]    Aldeen Riga, Arlyss Repress, MD 11/18/20 [phone]

## 2020-11-14 NOTE — Discharge Instructions (Addendum)

## 2020-11-14 NOTE — ED Triage Notes (Signed)
Pt c/o abd pain, diarrhea x 2 weeks-NAD-steady gait ?

## 2020-11-15 LAB — GC/CHLAMYDIA PROBE AMP (~~LOC~~) NOT AT ARMC
Chlamydia: POSITIVE — AB
Comment: NEGATIVE
Comment: NORMAL
Neisseria Gonorrhea: POSITIVE — AB

## 2021-01-12 ENCOUNTER — Encounter (HOSPITAL_BASED_OUTPATIENT_CLINIC_OR_DEPARTMENT_OTHER): Payer: Self-pay | Admitting: *Deleted

## 2021-01-12 ENCOUNTER — Emergency Department (HOSPITAL_BASED_OUTPATIENT_CLINIC_OR_DEPARTMENT_OTHER)
Admission: EM | Admit: 2021-01-12 | Discharge: 2021-01-12 | Disposition: A | Discharge status: Home / Self Care | Payer: Medicaid Other | Attending: Emergency Medicine | Admitting: Emergency Medicine

## 2021-01-12 ENCOUNTER — Other Ambulatory Visit: Payer: Self-pay

## 2021-01-12 DIAGNOSIS — Z5321 Procedure and treatment not carried out due to patient leaving prior to being seen by health care provider: Secondary | ICD-10-CM | POA: Insufficient documentation

## 2021-01-12 DIAGNOSIS — R319 Hematuria, unspecified: Secondary | ICD-10-CM | POA: Diagnosis present

## 2021-02-04 ENCOUNTER — Emergency Department (HOSPITAL_BASED_OUTPATIENT_CLINIC_OR_DEPARTMENT_OTHER)
Admission: EM | Admit: 2021-02-04 | Discharge: 2021-02-04 | Disposition: A | Discharge status: Home / Self Care | Payer: Medicaid Other | Attending: Emergency Medicine | Admitting: Emergency Medicine

## 2021-02-04 ENCOUNTER — Emergency Department (HOSPITAL_BASED_OUTPATIENT_CLINIC_OR_DEPARTMENT_OTHER): Payer: Medicaid Other

## 2021-02-04 ENCOUNTER — Encounter (HOSPITAL_BASED_OUTPATIENT_CLINIC_OR_DEPARTMENT_OTHER): Payer: Self-pay

## 2021-02-04 ENCOUNTER — Other Ambulatory Visit: Payer: Self-pay

## 2021-02-04 DIAGNOSIS — F17210 Nicotine dependence, cigarettes, uncomplicated: Secondary | ICD-10-CM | POA: Diagnosis not present

## 2021-02-04 DIAGNOSIS — S62615A Displaced fracture of proximal phalanx of left ring finger, initial encounter for closed fracture: Secondary | ICD-10-CM | POA: Diagnosis not present

## 2021-02-04 DIAGNOSIS — W230XXA Caught, crushed, jammed, or pinched between moving objects, initial encounter: Secondary | ICD-10-CM | POA: Diagnosis not present

## 2021-02-04 DIAGNOSIS — M79642 Pain in left hand: Secondary | ICD-10-CM | POA: Insufficient documentation

## 2021-02-04 DIAGNOSIS — S6992XA Unspecified injury of left wrist, hand and finger(s), initial encounter: Secondary | ICD-10-CM | POA: Diagnosis present

## 2021-02-04 MED ORDER — OXYCODONE-ACETAMINOPHEN 5-325 MG PO TABS
1.0000 | ORAL_TABLET | Freq: Once | ORAL | Status: AC
  Administered 2021-02-04: 14:00:00 1 via ORAL
  Filled 2021-02-04: qty 1

## 2021-02-04 MED ORDER — ONDANSETRON 4 MG PO TBDP
4.0000 mg | ORAL_TABLET | Freq: Once | ORAL | Status: AC
  Administered 2021-02-04: 14:00:00 4 mg via ORAL
  Filled 2021-02-04: qty 1

## 2021-02-04 MED ORDER — IBUPROFEN 600 MG PO TABS: 600.0000 mg | ORAL_TABLET | Freq: Four times a day (QID) | ORAL | 0 refills | Status: AC | PRN

## 2021-02-04 NOTE — Discharge Instructions (Addendum)
You were seen here today for evaluation of your left ring finger pain.  Your x-ray showed that you have a sure to the lower part of your left ring finger.  You have been placed in a splint.  Please keep the splint on and do not remove until you follow-up with hand surgery.  Referral to hand surgeon is attached to this paperwork, but you will need to call to schedule an appointment.  Additionally, I prescribed you ibuprofen 600 mg to take every 6 hours as needed for pain.  If you have any new or worsening symptoms such as temperature change in the finger, increase in swelling, increase in pain, please return to the nearest emergency department for reevaluation.

## 2021-02-04 NOTE — ED Provider Notes (Signed)
[facility] EMERGENCY DEPARTMENT Provider Note   CSN: [id number] Arrival date & time: 02/04/21  0935     History Chief Complaint  Patient presents with   Hand Pain    Amber Ward is a 25 y.o. female presents the emergency department for left finger pain.  Patient ports that last night her other than door closed on her left ring finger.  She reports mild pain and swelling.  Denies any numbness or tingling.  Patient reports her pain is under control, she just has pain with moving the finger.  Denies any medical moves her daily medications.  Surgical history includes C-section.  No known drug allergies.  Patient is a daily marijuana and tobacco smoker.  Denies any other drug use.   Hand Pain      Past Medical History:  Diagnosis Date   Trichomoniasis 2016    There are no problems to display for this patient.   Past Surgical History:  Procedure Laterality Date   CESAREAN SECTION       OB History     Gravida  3   Para  2   Term  2   Preterm  0   AB  0   Living         SAB  0   IAB  0   Ectopic  0   Multiple      Live Births              History reviewed. No pertinent family history.  Social History   Tobacco Use   Smoking status: Every Day    Types: Cigarettes   Smokeless tobacco: Never  Vaping Use   Vaping Use: Never used  Substance Use Topics   Alcohol use: Not Currently   Drug use: Yes    Types: Marijuana    Home Medications Prior to Admission medications   Medication Sig Start Date End Date Taking? Authorizing Provider  ibuprofen (ADVIL) 600 MG tablet Take 1 tablet (600 mg total) by mouth every 6 (six) hours as needed. 02/04/21  Yes Achille Rich, PA-C  hydrocortisone (ANUSOL-HC) 25 MG suppository Place 1 suppository (25 mg total) rectally 2 (two) times daily. Patient not taking: Reported on 12/15/2014 08/31/12   Linna Hoff, MD  LamoTRIgine (LAMICTAL PO) Take by mouth.    [provider]  polyethylene  glycol (MIRALAX / GLYCOLAX) packet Take 17 g by mouth daily. Patient not taking: Reported on 12/15/2014 08/31/12   Linna Hoff, MD  Prenatal Vit-Fe Fumarate-FA (PRENATAL VITAMIN PO) Take by mouth.    [provider]  promethazine (PHENERGAN) 25 MG tablet Take 1 tablet (25 mg total) by mouth every 6 (six) hours as needed for nausea or vomiting. 04/27/19   Aviva Signs, CNM    Allergies    Patient has no known allergies.  Review of Systems   Review of Systems  Musculoskeletal:        Finger pain and swelling  All other systems reviewed and are negative.  Physical Exam Updated Vital Signs BP 129/82 (BP Location: Right Arm)   Pulse 82   Temp 98 F (36.7 C) (Oral)   Resp 18   Ht 5\' 4"  (1.626 m)   Wt 86.2 kg   LMP 01/09/2021 (Approximate)   SpO2 99%   BMI 32.61 kg/m   Physical Exam Vitals and nursing note reviewed.  Constitutional:      Appearance: Normal appearance.  Eyes:     General: No  scleral icterus. Pulmonary:     Effort: Pulmonary effort is normal. No respiratory distress.  Musculoskeletal:        General: Swelling and tenderness present. No deformity.     Comments: Decreased range of motion secondary to pain.  Patient has mild to moderate swelling of the left ring finger.  Bruising noted to the area.  No overlying abrasion, laceration, or rash noted.  No obvious deformity.  Cap refill intact.  Strong radial pulse.  Compartments soft.  Tattoos present.  Skin:    General: Skin is dry.     Findings: No rash.  Neurological:     General: No focal deficit present.     Mental Status: She is alert. Mental status is at baseline.  Psychiatric:        Mood and Affect: Mood normal.    ED Results / Procedures / Treatments   Labs (all labs ordered are listed, but only abnormal results are displayed) Labs Reviewed - No data to display  EKG None  Radiology DG Hand Complete Left  Result Date: 02/04/2021 CLINICAL DATA:  Initial encounter for trauma and  pain. EXAM: LEFT HAND - COMPLETE 3+ VIEW COMPARISON:  None. FINDINGS: Mildly displaced, oblique fracture of the proximal phalanx of the fourth digit. No intra-articular extension. IMPRESSION: Fourth proximal phalanx fracture. Electronically Signed   By: Jeronimo Greaves M.D.   On: 02/04/2021 11:57    Procedures Procedures   Medications Ordered in ED Medications  oxyCODONE-acetaminophen (PERCOCET/ROXICET) 5-325 MG per tablet 1 tablet (has no administration in time range)  ondansetron (ZOFRAN-ODT) disintegrating tablet 4 mg (has no administration in time range)    ED Course  I have reviewed the triage vital signs and the nursing notes.  Pertinent labs & imaging results that were available during my care of the patient were reviewed by me and considered in my medical decision making (see chart for details).  25 year old female presents with left ring finger pain.  X-ray shows mildly displaced, oblique fracture of the proximal phalanx of the fourth digit on the left hand.  We will place patient in an aluminum hand splint extending into the palm and buddy tape to adjacent finger.  Will refer to hand surgery.  Patient did not drive here today and will be given a Percocet while emergency department.  No signs of open fracture.  There is no overlying skin change, abrasion, laceration noted to the area.  Patient has sensation intact with good cap refill to the affected finger.  Will refer to hand surgery for follow-up.  Prescribed 600 mg of ibuprofen to take every 6 hours as needed for pain.  Return precautions discussed.  Patient agrees to plan.  Patient is stable and being discharged home in good condition.    MDM Rules/Calculators/A&P                          Final Clinical Impression(s) / ED Diagnoses Final diagnoses:  Closed displaced fracture of proximal phalanx of left ring finger, initial encounter    Rx / DC Orders ED Discharge Orders          Ordered    ibuprofen (ADVIL) 600 MG tablet   Every 6 hours PRN        02/04/21 1330             Achille Rich, PA-C 02/04/21 1331    Gloris Manchester, MD 02/06/21 [phone]

## 2021-02-04 NOTE — ED Triage Notes (Signed)
Patient states she jammed her left ring finger today on the stove. Patient denies burn. +PSCM noted Patient states she is having problems moving it.

## 2021-02-04 NOTE — ED Notes (Signed)
Reports, "feels better after bulky hand, ACE wrap and volar splint". Encouraged RICE therapy.

## 2022-02-10 IMAGING — CR DG HAND COMPLETE 3+V*L*
3 series · 3 of 3 positions shown · non-contrast
Comparison: None.

CLINICAL DATA: Initial encounter for trauma and pain.

EXAM:
LEFT HAND - COMPLETE 3+ VIEW

[x hand pa left]
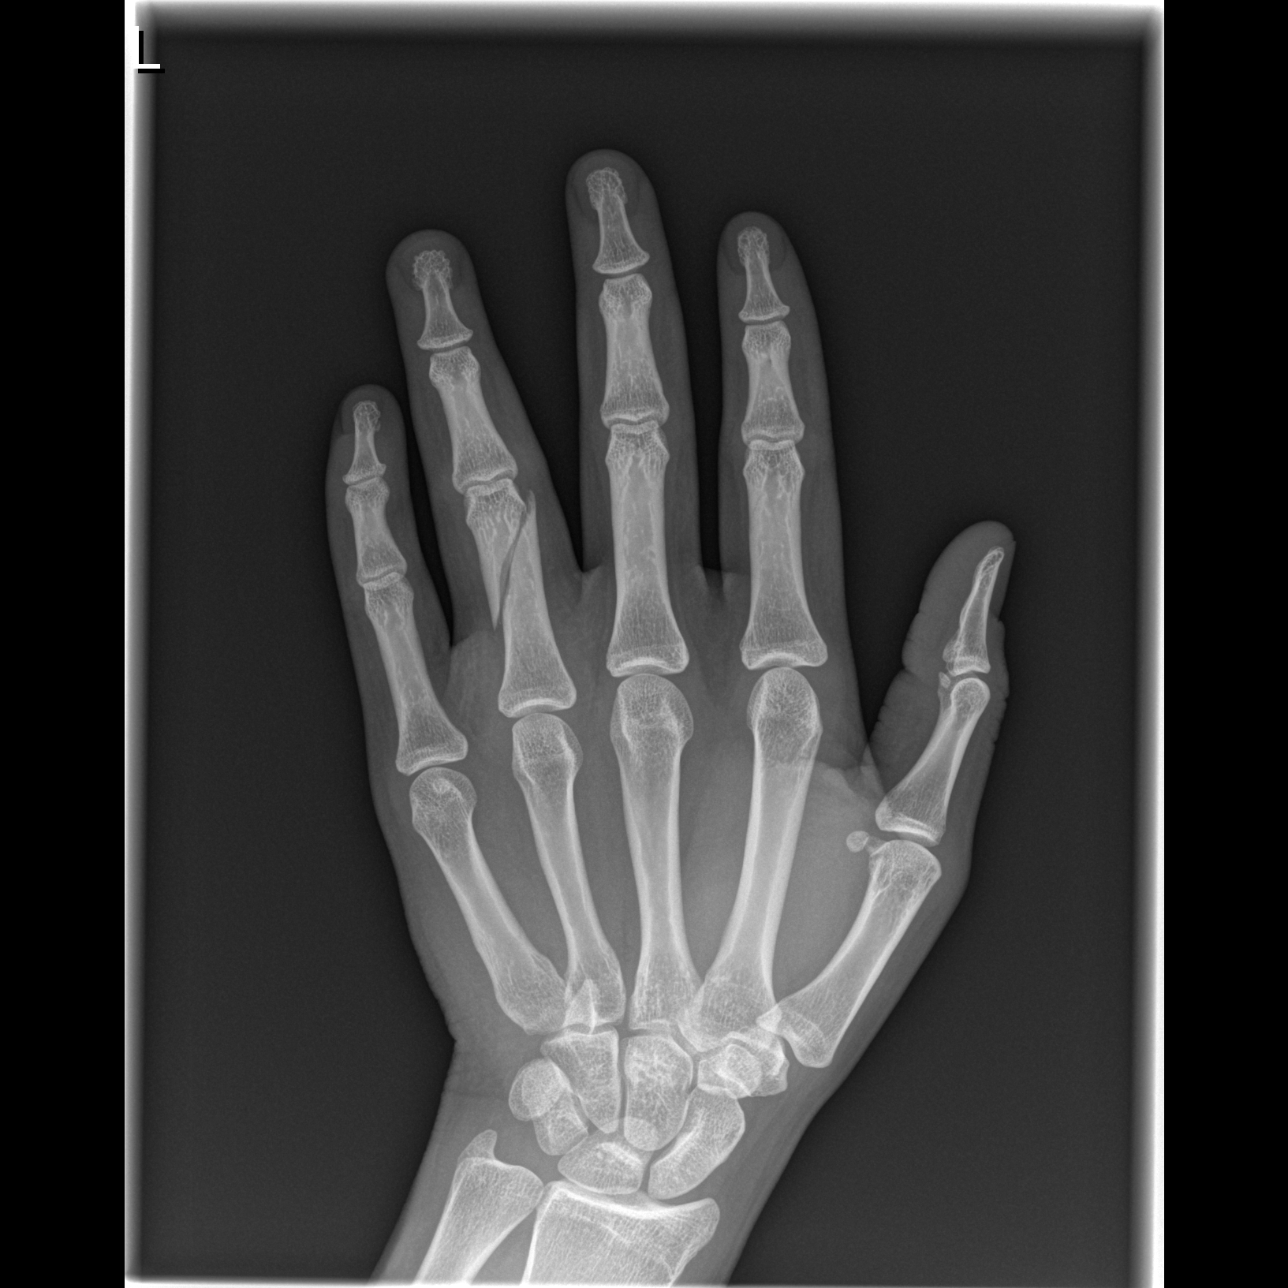

[x hand oblique left]
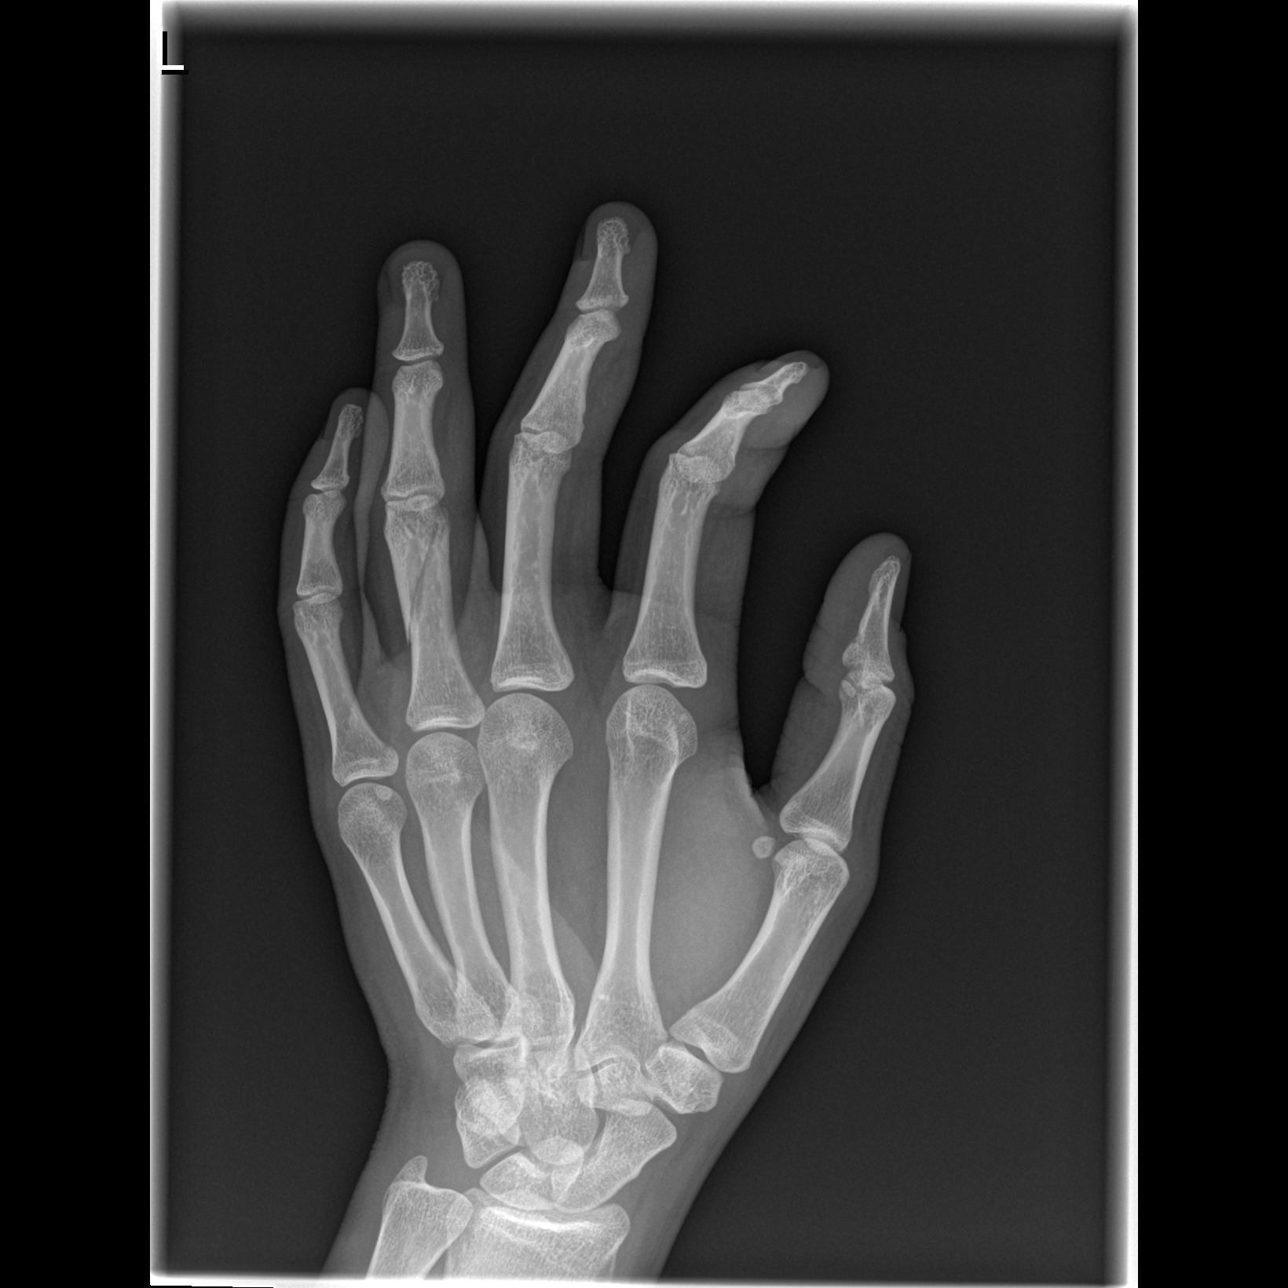

[x hand lat left]
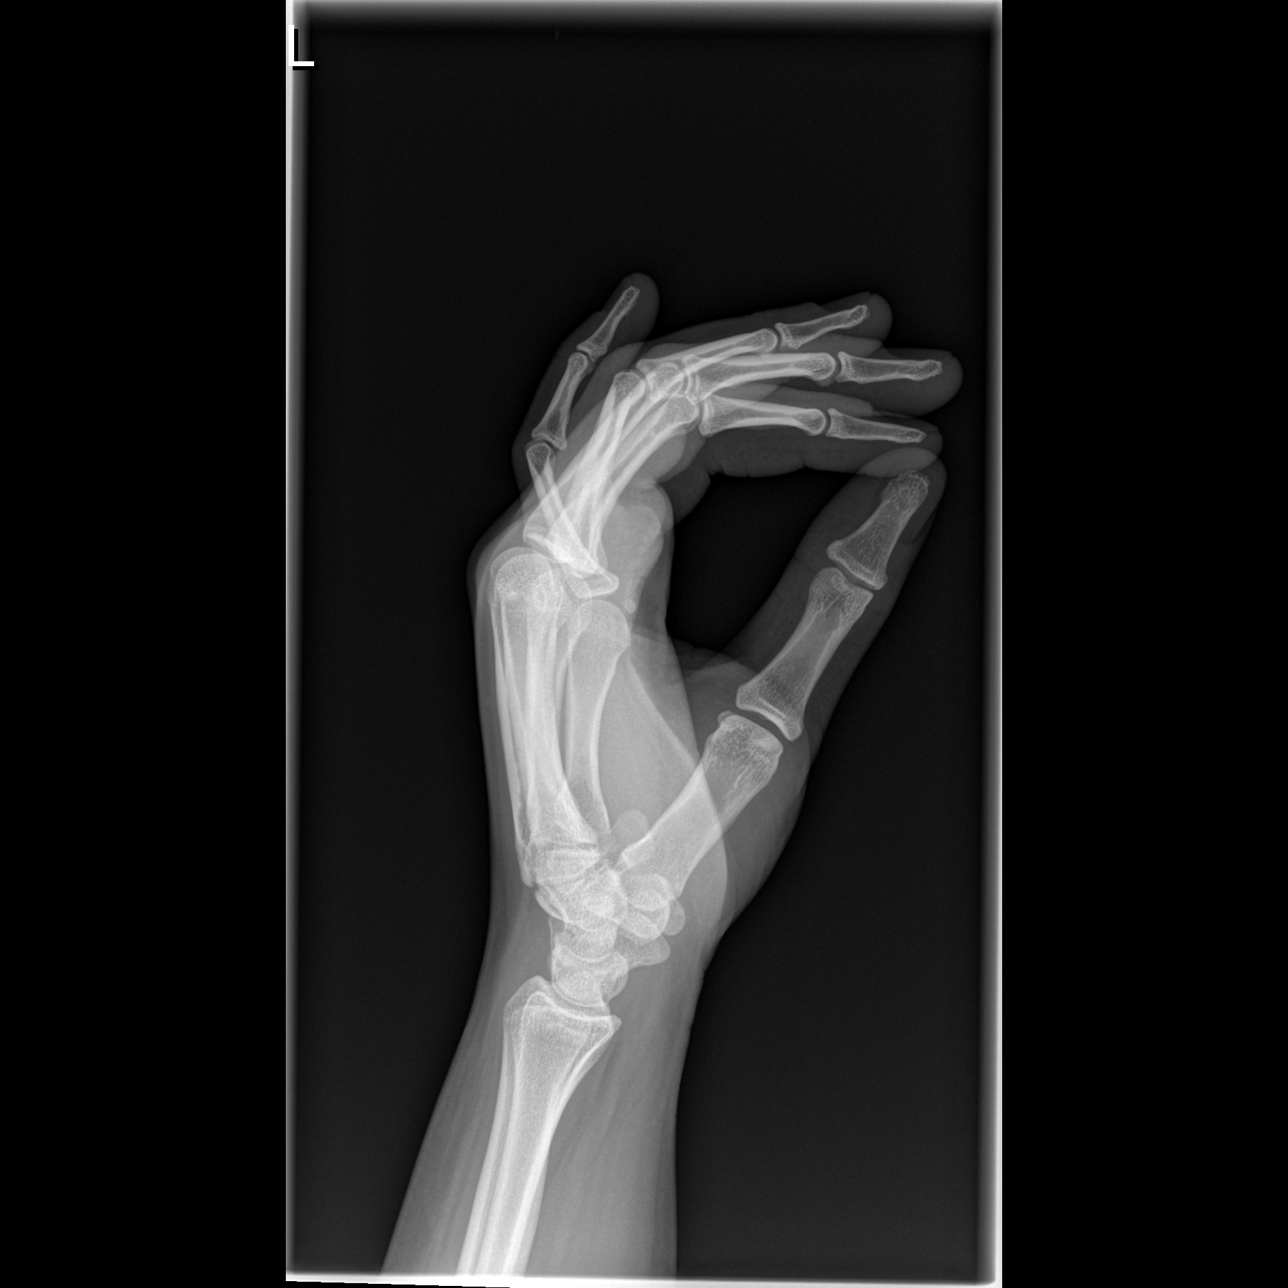

[3 of 3 positions shown; findings below may reference images not displayed]

FINDINGS: Mildly displaced, oblique fracture of the proximal phalanx of the
fourth digit. No intra-articular extension.
IMPRESSION: Fourth proximal phalanx fracture.

## 2023-06-01 ENCOUNTER — Telehealth: Payer: Self-pay

## 2023-06-01 NOTE — Telephone Encounter (Signed)
 Patient called in stating that she is [redacted] weeks pregnant and was seen at [facility]'s ED over the weekend. Patient stated that she needs to have an OB appointment prior to being able to get into a residential treatment program.   Spoke with [facility], RN and we agreed to add her into a cancellation on Jessica Emly's schedule for tomorrow. Patient was given our address and agreed to bring insurance and drivers license information.

## 2023-06-02 ENCOUNTER — Other Ambulatory Visit: Payer: Self-pay

## 2023-06-02 ENCOUNTER — Ambulatory Visit (INDEPENDENT_AMBULATORY_CARE_PROVIDER_SITE_OTHER): Payer: Self-pay

## 2023-06-02 VITALS — BP 116/72 | HR 85 | Wt 173.0 lb

## 2023-06-02 DIAGNOSIS — O0992 Supervision of high risk pregnancy, unspecified, second trimester: Secondary | ICD-10-CM | POA: Diagnosis not present

## 2023-06-02 DIAGNOSIS — D563 Thalassemia minor: Secondary | ICD-10-CM

## 2023-06-02 DIAGNOSIS — Z98891 History of uterine scar from previous surgery: Secondary | ICD-10-CM | POA: Diagnosis not present

## 2023-06-02 DIAGNOSIS — O0990 Supervision of high risk pregnancy, unspecified, unspecified trimester: Secondary | ICD-10-CM

## 2023-06-02 DIAGNOSIS — Z3A23 23 weeks gestation of pregnancy: Secondary | ICD-10-CM | POA: Diagnosis not present

## 2023-06-02 DIAGNOSIS — D573 Sickle-cell trait: Secondary | ICD-10-CM

## 2023-06-02 DIAGNOSIS — F1911 Other psychoactive substance abuse, in remission: Secondary | ICD-10-CM

## 2023-06-02 DIAGNOSIS — O289 Unspecified abnormal findings on antenatal screening of mother: Secondary | ICD-10-CM

## 2023-06-02 DIAGNOSIS — F1011 Alcohol abuse, in remission: Secondary | ICD-10-CM

## 2023-06-02 DIAGNOSIS — F17210 Nicotine dependence, cigarettes, uncomplicated: Secondary | ICD-10-CM

## 2023-06-02 NOTE — Progress Notes (Signed)
 Subjective:   Amber Ward is a 28 y.o. [id number] at [redacted]w[redacted]d by 2nd Trimester [place] on May 21, 2022 being seen today for her first obstetrical visit.  Patient reports she was not on birth control prior to conception.  She states she has had a Nexplanon in the past.  She declines PP BCM.    Gynecological/Obstetrical History: Patient reports denies history of gynecological surgeries.  Patient denies history of abnormal pap smears. She endorses having pap smear last year.   Pregnancy history fully reviewed. Patient does intend to breast and bottle feed. Patient obstetrical history is significant for  C/S .   Sexual Activity and Vaginal Concerns: Patient is not currently sexually active.  She denies vaginal discharge, bleeding, irritation, or odor. Patient also denies pain or difficulty with urination.    Medical History/ROS: Patient denies medical history significant for cardiovascular, respiratory, gastrointestinal, or hematological disorders. Patient also denies history of MH disorders including anxiety and depression.  Patient reports no complaints.  Patient denies constipation/diarrhea or nausea/vomiting.  No recurrent headaches.    Social History: Patient denies history or current usage of tobacco, alcohol, vaping, or drugs.  Patient reports that she "does not have any baby daddy."  Patient reports that she lives with mom and endorses safety at home.  Patient denies DV/A. Patient is not currently employed, but "is looking."  Patient mother present and without questions, comments, or concerns.   HISTORY: OB History  Gravida Para Term Preterm AB Living  5 4 4  0 0 3  SAB IAB Ectopic Multiple Live Births  0 0 0 0 3    # Outcome Date GA Lbr Len/2nd Weight Sex Type Anes PTL Lv  5 Current           4 Term 06/09/19 [redacted]w[redacted]d  5 lb (2.268 kg) M CS-LTranv Spinal N LIV  3 Term 06/13/15   7 lb (3.175 kg) M CS-LTranv Spinal N LIV  2 Term 01/15/11   7 lb (3.175 kg) F CS-LTranv EPI N LIV  1  Term      CS-Unspec       Last pap smear was done last year and was normal per patient. Declines pap today.   Past Medical History:  Diagnosis Date   Anemia    Hx of drug abuse (HCC)    Trichomoniasis 2016   Past Surgical History:  Procedure Laterality Date   CESAREAN SECTION     No family history on file. Social History   Tobacco Use   Smoking status: Former    Types: Cigarettes   Smokeless tobacco: Never  Vaping Use   Vaping status: Former  Substance Use Topics   Alcohol use: Not Currently    Comment: 1 pint of liquor   Drug use: Yes    Types: Marijuana, Cocaine   No Known Allergies Current Outpatient Medications on File Prior to Visit  Medication Sig Dispense Refill   Prenatal Vit-Fe Fumarate-FA (PRENATAL VITAMIN PO) Take by mouth.     hydrocortisone (ANUSOL-HC) 25 MG suppository Place 1 suppository (25 mg total) rectally 2 (two) times daily. (Patient not taking: Reported on 06/02/2023) 12 suppository 1   ibuprofen (ADVIL) 600 MG tablet Take 1 tablet (600 mg total) by mouth every 6 (six) hours as needed. (Patient not taking: Reported on 06/02/2023) 30 tablet 0   LamoTRIgine (LAMICTAL PO) Take by mouth. (Patient not taking: Reported on 06/02/2023)     polyethylene glycol (MIRALAX / GLYCOLAX) packet Take 17 g  by mouth daily. (Patient not taking: Reported on 06/02/2023) 30 each 0   promethazine (PHENERGAN) 25 MG tablet Take 1 tablet (25 mg total) by mouth every 6 (six) hours as needed for nausea or vomiting. (Patient not taking: Reported on 06/02/2023) 30 tablet 2   No current facility-administered medications on file prior to visit.    Review of Systems Pertinent items noted in HPI and remainder of comprehensive ROS otherwise negative.  Exam   Vitals:   06/02/23 1006  BP: 116/72  Pulse: 85  Weight: 173 lb (78.5 kg)   Fetal Heart Rate (bpm): 144  Physical Exam Constitutional:      Appearance: Normal appearance.  HENT:     Head: Normocephalic and atraumatic.   Eyes:     Conjunctiva/sclera: Conjunctivae normal.  Cardiovascular:     Rate and Rhythm: Normal rate.  Pulmonary:     Effort: Pulmonary effort is normal. No respiratory distress.  Abdominal:     Palpations: Abdomen is soft.     Tenderness: There is no abdominal tenderness.     Comments: Gravid, FH at 25cm  Musculoskeletal:        General: Normal range of motion.     Cervical back: Normal range of motion.  Neurological:     Mental Status: She is alert and oriented to person, place, and time.  Skin:    General: Skin is warm and dry.  Psychiatric:        Mood and Affect: Mood normal.        Behavior: Behavior normal.  Vitals reviewed.     Assessment:   28 y.o. year old [id number] Patient Active Problem List   Diagnosis Date Noted   Supervision of high risk pregnancy, antepartum 06/02/2023     Plan:  1. Supervision of high risk pregnancy, antepartum -Patient welcomed to practice. -Anticipatory guidance for interventions during prenatal visits including labs, ultrasounds, and testing; Initial labs ordered. -Genetic Screening discussed, Quad screen and NIPS: ordered. -Ultrasound discussed; fetal anatomic survey: ordered. -Discussed estimated due date of September 24, 2023. -Continue prenatal vitamins  -Encouraged to seek out care at office or emergency room for urgent and/or emergent concerns. -Educated on the nature of [facility] [place] [facility] with multiple MDs and other Advanced Practice Providers was explained to patient; also emphasized that residents, students are part of our team. Informed of her right to refuse care as she deems appropriate.  -Encouraged to complete and utilize MyChart Registration for her ability to review results, send requests, and have questions addressed.  -No questions or concerns.   2. History of cesarean section -C/S x 3  -Reports initial was due to age and small pelvis.   3. [redacted] weeks gestation of pregnancy -Based on 22  week [place]. -Will schedule anatomy US.    4. History of drug abuse (HCC) -Patient denies current usage. -Labs from 3/13 shows + Cocaine and +THC.  Patient states results inaccurate. -Informed that message would be sent to [facility] provider for review of chart for patient appropriateness for program.  -Denies recent usage.   5. History of alcohol abuse -12/28/2022 Encounter for detox. -Negative alcohol level and biomarkers 05/21/2023 after reporting usage of pint daily.  -Denies recent usage  Labs on 3/13 from note of Seif Asencion Islam, MD & Lorraine Lax, MD (Attending): 3/15 sodium 133, potassium 3.6, glucose 74, BUN 7, creatinine 0.51 3/13 WBC 7.8, hemoglobin 11, MCV 80.2, platelet count 373, differential unremarkable 3/13 GGT 20 3/13 AST 13,  ALT 10 3/13 hCG 12,916 3/13 hemoglobin A1c 4.7 3/13 TSH 1.15 3/13 total cholesterol 154, HDL 58, LDL 71, triglycerides 167 8/29 UDS positive for cocaine and cannabis 3/13 BAL negative 3/13 EKG QTc 427   Problem list reviewed and updated. Routine obstetric precautions reviewed.  No orders of the defined types were placed in this encounter.   No follow-ups on file.     Cherre Robins, CNM 06/02/2023 10:29 AM

## 2023-06-03 LAB — CBC/D/PLT+RPR+RH+ABO+RUBIGG...
Antibody Screen: NEGATIVE
Basophils Absolute: 0 10*3/uL (ref 0.0–0.2)
Basos: 1 %
EOS (ABSOLUTE): 0.2 10*3/uL (ref 0.0–0.4)
Eos: 3 %
HCV Ab: NONREACTIVE
HIV Screen 4th Generation wRfx: NONREACTIVE
Hematocrit: 33.4 % — ABNORMAL LOW (ref 34.0–46.6)
Hemoglobin: 10.8 g/dL — ABNORMAL LOW (ref 11.1–15.9)
Hepatitis B Surface Ag: NEGATIVE
Immature Grans (Abs): 0 10*3/uL (ref 0.0–0.1)
Immature Granulocytes: 0 %
Lymphocytes Absolute: 1.4 10*3/uL (ref 0.7–3.1)
Lymphs: 24 %
MCH: 26 pg — ABNORMAL LOW (ref 26.6–33.0)
MCHC: 32.3 g/dL (ref 31.5–35.7)
MCV: 80 fL (ref 79–97)
Monocytes Absolute: 0.3 10*3/uL (ref 0.1–0.9)
Monocytes: 6 %
Neutrophils Absolute: 3.8 10*3/uL (ref 1.4–7.0)
Neutrophils: 66 %
Platelets: 351 10*3/uL (ref 150–450)
RBC: 4.16 x10E6/uL (ref 3.77–5.28)
RDW: 13.1 % (ref 11.7–15.4)
RPR Ser Ql: NONREACTIVE
Rh Factor: POSITIVE
Rubella Antibodies, IGG: 2.08 {index} (ref >0.99)
WBC: 5.8 10*3/uL (ref 3.4–10.8)

## 2023-06-03 LAB — HEMOGLOBIN A1C
Est. average glucose Bld gHb Est-mCnc: 103 mg/dL
Hgb A1c MFr Bld: 5.2 % (ref 4.8–5.6)

## 2023-06-03 LAB — HCV INTERPRETATION

## 2023-06-04 DIAGNOSIS — O99012 Anemia complicating pregnancy, second trimester: Secondary | ICD-10-CM | POA: Insufficient documentation

## 2023-06-04 LAB — CULTURE, OB URINE

## 2023-06-04 LAB — URINE CULTURE, OB REFLEX

## 2023-06-04 MED ORDER — FERROUS SULFATE 325 (65 FE) MG PO TBEC: 325.0000 mg | DELAYED_RELEASE_TABLET | ORAL | 1 refills | Status: AC

## 2023-06-04 NOTE — Addendum Note (Signed)
 Addended by: Gerrit Heck L on: 06/04/2023 02:52 PM   Modules accepted: Orders

## 2023-06-09 LAB — PANORAMA PRENATAL TEST FULL PANEL:PANORAMA TEST PLUS 5 ADDITIONAL MICRODELETIONS: FETAL FRACTION: 7.3

## 2023-06-12 LAB — HORIZON CUSTOM: REPORT SUMMARY: POSITIVE — AB

## 2023-06-14 DIAGNOSIS — O289 Unspecified abnormal findings on antenatal screening of mother: Secondary | ICD-10-CM | POA: Insufficient documentation

## 2023-06-14 DIAGNOSIS — D563 Thalassemia minor: Secondary | ICD-10-CM | POA: Insufficient documentation

## 2023-06-14 DIAGNOSIS — D573 Sickle-cell trait: Secondary | ICD-10-CM | POA: Insufficient documentation

## 2023-06-14 NOTE — Addendum Note (Signed)
 Addended by: Gerrit Heck L on: 06/14/2023 05:12 AM   Modules accepted: Orders

## 2023-07-14 ENCOUNTER — Encounter: Admitting: Family Medicine

## 2023-07-15 ENCOUNTER — Telehealth: Payer: Self-pay | Admitting: Family Medicine

## 2023-07-15 NOTE — Telephone Encounter (Signed)
 I tried to reach out to the patient to get her rescheduled, No answer message was left.

## 2023-07-20 ENCOUNTER — Encounter: Payer: Self-pay | Admitting: Obstetrics and Gynecology

## 2023-07-29 ENCOUNTER — Encounter: Admitting: Family Medicine
